# Patient Record
Sex: Male | Born: 1980 | Race: White | Hispanic: No | Marital: Married | Smoking: Current every day smoker
Health system: Southern US, Community
[De-identification: ages and names within clinical notes are randomized; demographics above are authoritative.]

## PROBLEM LIST (undated history)

## (undated) ENCOUNTER — Emergency Department (HOSPITAL_BASED_OUTPATIENT_CLINIC_OR_DEPARTMENT_OTHER): Payer: No Typology Code available for payment source

## (undated) HISTORY — PX: HERNIA REPAIR: SHX51

---

## 2017-06-21 ENCOUNTER — Emergency Department (HOSPITAL_BASED_OUTPATIENT_CLINIC_OR_DEPARTMENT_OTHER)
Admission: EM | Admit: 2017-06-21 | Discharge: 2017-06-21 | Disposition: A | Discharge status: Home / Self Care | Payer: BLUE CROSS/BLUE SHIELD | Attending: Emergency Medicine | Admitting: Emergency Medicine

## 2017-06-21 ENCOUNTER — Other Ambulatory Visit: Payer: Self-pay

## 2017-06-21 ENCOUNTER — Emergency Department (HOSPITAL_BASED_OUTPATIENT_CLINIC_OR_DEPARTMENT_OTHER): Payer: BLUE CROSS/BLUE SHIELD

## 2017-06-21 ENCOUNTER — Encounter (HOSPITAL_BASED_OUTPATIENT_CLINIC_OR_DEPARTMENT_OTHER): Payer: Self-pay

## 2017-06-21 DIAGNOSIS — M545 Low back pain, unspecified: Secondary | ICD-10-CM

## 2017-06-21 DIAGNOSIS — F17200 Nicotine dependence, unspecified, uncomplicated: Secondary | ICD-10-CM | POA: Diagnosis not present

## 2017-06-21 MED ORDER — CYCLOBENZAPRINE HCL 5 MG PO TABS
5.0000 mg | ORAL_TABLET | Freq: Once | ORAL | Status: AC
  Administered 2017-06-21: 5 mg via ORAL
  Filled 2017-06-21: qty 1

## 2017-06-21 MED ORDER — OXYCODONE-ACETAMINOPHEN 5-325 MG PO TABS
1.0000 | ORAL_TABLET | Freq: Once | ORAL | Status: AC
  Administered 2017-06-21: 1 via ORAL
  Filled 2017-06-21: qty 1

## 2017-06-21 MED ORDER — CYCLOBENZAPRINE HCL 10 MG PO TABS: 10.0000 mg | ORAL_TABLET | Freq: Two times a day (BID) | ORAL | 0 refills | Status: DC | PRN

## 2017-06-21 MED ORDER — METHYLPREDNISOLONE 4 MG PO TBPK
ORAL_TABLET | ORAL | 0 refills | Status: DC
Start: 2017-06-21 — End: 2018-03-05

## 2017-06-21 NOTE — ED Notes (Signed)
ED Provider at bedside. 

## 2017-06-21 NOTE — ED Notes (Signed)
Patient transported to X-ray 

## 2017-06-21 NOTE — Discharge Instructions (Signed)
It was my pleasure taking care of you today!   You have been seen in the Emergency Department today for back pain.   Take steroids as directed. Flexeril is your muscle relaxer to take as needed. In addition to this, use ice and/or heat for additional pain relief.  Your back pain should get better over the next 2 weeks. Please follow up with your doctor this week for a recheck if still having symptoms.  COLD THERAPY DIRECTIONS:  Ice or gel packs can be used to reduce both pain and swelling. Ice is the most helpful within the first 24 to 48 hours after an injury or flareup from overusing a muscle or joint.  Ice is effective, has very few side effects, and is safe for most people to use.    Return to the ED for worsening back pain, fever, weakness or numbness of either leg, or if you develop either (1) an inability to urinate or have bowel movements, or (2) loss of your ability to control your bathroom functions (if you start having "accidents"), or if you develop other new symptoms that concern you.

## 2017-06-21 NOTE — ED Provider Notes (Signed)
[facility] EMERGENCY DEPARTMENT Provider Note   CSN: [id number] Arrival date & time: 06/21/17  1228     History   Chief Complaint Chief Complaint  Patient presents with  . Back Pain    HPI Maurice Allen is a 36 y.o. male.  The history is provided by the patient and medical records. No language interpreter was used.  Back Pain   Pertinent negatives include no fever, no numbness, no abdominal pain, no dysuria and no weakness.   Maurice Allen is an otherwise healthy 36 y.o. male who presents to the Emergency Department complaining of mid to right-sided low back which began one week ago, but acutely worsened over the last 2-3 days. Pain worse with certain movements. Hx of similar in the past, but never this severe. Typically improves with BC powder, but he tried that this week with no improvement. Patient denies upper back or neck pain. No fever, saddle anesthesia, weakness, numbness, urinary complaints including retention/incontinence. No history of cancer, IVDU, or recent spinal procedures.  History reviewed. No pertinent past medical history.  There are no active problems to display for this patient.   Past Surgical History:  Procedure Laterality Date  . HERNIA REPAIR         Home Medications    Prior to Admission medications   Medication Sig Start Date End Date Taking? Authorizing Provider  cyclobenzaprine (FLEXERIL) 10 MG tablet Take 1 tablet (10 mg total) 2 (two) times daily as needed by mouth for muscle spasms. 06/21/17   Ward, Chase PicketJaime Pilcher, PA-C  methylPREDNISolone (MEDROL DOSEPAK) 4 MG TBPK tablet Take as directed on package. 06/21/17   Ward, Chase PicketJaime Pilcher, PA-C    Family History No family history on file.  Social History Social History   Tobacco Use  . Smoking status: Current Every Day Smoker  . Smokeless tobacco: Current User  Substance Use Topics  . Alcohol use: Yes    Comment: occ  . Drug use: No     Allergies   Patient has no known  allergies.   Review of Systems Review of Systems  Constitutional: Negative for chills and fever.  Gastrointestinal: Negative for abdominal pain, nausea and vomiting.  Genitourinary: Negative for difficulty urinating and dysuria.  Musculoskeletal: Positive for back pain.  Allergic/Immunologic: Negative for immunocompromised state.  Neurological: Negative for weakness and numbness.     Physical Exam Updated Vital Signs BP (!) 145/88 (BP Location: Left Arm)   Pulse 93   Temp 98.2 F (36.8 C) (Oral)   Resp 18   Ht 5\' 10"  (1.778 m)   Wt 83.9 kg (185 lb)   SpO2 98%   BMI 26.54 kg/m   Physical Exam  Constitutional: He is oriented to person, place, and time. He appears well-developed and well-nourished.  Neck:  No midline or paraspinal tenderness. Full ROM without pain.  Cardiovascular: Normal rate, regular rhythm, normal heart sounds and intact distal pulses.  Pulmonary/Chest: Effort normal and breath sounds normal. No respiratory distress.  Abdominal: Soft. Bowel sounds are normal. He exhibits no distension. There is no tenderness.  Musculoskeletal:       Back:  Tenderness to palpation as depicted in image. 5/5 muscle strength in bilateral LE's. Straight leg raises are negative bilaterally for radicular symptoms. Able to ambulate independently with steady gait.  Neurological: He is alert and oriented to person, place, and time. He has normal reflexes.  Bilateral lower extremities neurovascularly intact.  Skin: Skin is warm and dry. No rash noted. No erythema.  Nursing note and vitals reviewed.    ED Treatments / Results  Labs (all labs ordered are listed, but only abnormal results are displayed) Labs Reviewed - No data to display  EKG  EKG Interpretation None       Radiology Dg Lumbar Spine Complete  Result Date: 06/21/2017 CLINICAL DATA:  No known injury, low back pain for 1 week EXAM: LUMBAR SPINE - COMPLETE 4+ VIEW COMPARISON:  None. FINDINGS: There is no  evidence of lumbar spine fracture. Alignment is normal. Intervertebral disc spaces are maintained. IMPRESSION: No acute osseous injury of the lumbar spine. Electronically Signed   By: Elige KoHetal  Patel   On: 06/21/2017 14:07    Procedures Procedures (including critical care time)  Medications Ordered in ED Medications  oxyCODONE-acetaminophen (PERCOCET/ROXICET) 5-325 MG per tablet 1 tablet (1 tablet Oral Given 06/21/17 1349)  cyclobenzaprine (FLEXERIL) tablet 5 mg (5 mg Oral Given 06/21/17 1350)     Initial Impression / Assessment and Plan / ED Course  I have reviewed the triage vital signs and the nursing notes.  Pertinent labs & imaging results that were available during my care of the patient were reviewed by me and considered in my medical decision making (see chart for details).    Maurice SnooksJohn Krogstad is a 36 y.o. male who presents to ED for low back pain.   Patient demonstrates no lower extremity weakness, saddle anesthesia, bowel or bladder incontinence or neuro deficits. No concern for cauda equina. No fevers or other infectious symptoms to suggest that the patient's back pain is due to an infection. Lower extremities are neurovascularly intact and patient is ambulating without difficulty. I have reviewed return precautions, including the development of any of these signs or symptoms and the patient has voiced understanding. I reviewed symptomatic home care instructions and sports medicine follow-up if symptoms do not improve. RX for medrol dose pack and flexeril given. Patient voiced understanding and agreement with plan. All questions answered.  Final Clinical Impressions(s) / ED Diagnoses   Final diagnoses:  Acute right-sided low back pain without sciatica    ED Discharge Orders        Ordered    methylPREDNISolone (MEDROL DOSEPAK) 4 MG TBPK tablet     06/21/17 1454    cyclobenzaprine (FLEXERIL) 10 MG tablet  2 times daily PRN     06/21/17 1454       Ward, Chase PicketJaime Pilcher,  PA-C 06/21/17 1531    Loren RacerYelverton, David, MD 06/26/17 1745

## 2017-06-21 NOTE — ED Triage Notes (Signed)
C/o lower back pain x 1 week-worse x 3 days-denies injury-slow gait -NAD

## 2017-07-18 ENCOUNTER — Telehealth: Payer: Self-pay | Admitting: Medical

## 2017-07-18 NOTE — Telephone Encounter (Signed)
Was curious was curious why I am getting some CRM about new patients.  I keep reviewing these as if something needs to be done?  This is just an FYI about new patient?  I think it is marked as high priority?

## 2017-07-28 ENCOUNTER — Ambulatory Visit: Payer: BLUE CROSS/BLUE SHIELD | Admitting: Medical

## 2017-11-01 DIAGNOSIS — J209 Acute bronchitis, unspecified: Secondary | ICD-10-CM | POA: Diagnosis not present

## 2017-11-01 DIAGNOSIS — J0100 Acute maxillary sinusitis, unspecified: Secondary | ICD-10-CM | POA: Diagnosis not present

## 2018-03-05 ENCOUNTER — Ambulatory Visit (INDEPENDENT_AMBULATORY_CARE_PROVIDER_SITE_OTHER): Payer: 59 | Admitting: Medical

## 2018-03-05 ENCOUNTER — Encounter: Payer: Self-pay | Admitting: Medical

## 2018-03-05 ENCOUNTER — Encounter (INDEPENDENT_AMBULATORY_CARE_PROVIDER_SITE_OTHER): Payer: Self-pay

## 2018-03-05 VITALS — BP 134/80 | HR 98 | Temp 98.3°F | Resp 16 | Ht 70.0 in | Wt 205.8 lb

## 2018-03-05 DIAGNOSIS — L989 Disorder of the skin and subcutaneous tissue, unspecified: Secondary | ICD-10-CM | POA: Diagnosis not present

## 2018-03-05 DIAGNOSIS — Z0000 Encounter for general adult medical examination without abnormal findings: Secondary | ICD-10-CM | POA: Diagnosis not present

## 2018-03-05 DIAGNOSIS — Z113 Encounter for screening for infections with a predominantly sexual mode of transmission: Secondary | ICD-10-CM | POA: Diagnosis not present

## 2018-03-05 DIAGNOSIS — Z23 Encounter for immunization: Secondary | ICD-10-CM

## 2018-03-05 NOTE — Patient Instructions (Addendum)
For you wellness exam today I have ordered cbc, cmp, lipid panel, ua and hiv.(future labs to be done fasting)  Vaccine given today tdap.  Recommend exercise and healthy diet.  We will let you know lab results as they come in.  Follow up date appointment will be determined after lab review.   Please stop smoking or decrease even further.    Preventive Care 18-39 Years, Male Preventive care refers to lifestyle choices and visits with your health care provider that can promote health and wellness. What does preventive care include?  A yearly physical exam. This is also called an annual well check.  Dental exams once or twice a year.  Routine eye exams. Ask your health care provider how often you should have your eyes checked.  Personal lifestyle choices, including: ? Daily care of your teeth and gums. ? Regular physical activity. ? Eating a healthy diet. ? Avoiding tobacco and drug use. ? Limiting alcohol use. ? Practicing safe sex. What happens during an annual well check? The services and screenings done by your health care provider during your annual well check will depend on your age, overall health, lifestyle risk factors, and family history of disease. Counseling Your health care provider may ask you questions about your:  Alcohol use.  Tobacco use.  Drug use.  Emotional well-being.  Home and relationship well-being.  Sexual activity.  Eating habits.  Work and work Statistician.  Screening You may have the following tests or measurements:  Height, weight, and BMI.  Blood pressure.  Lipid and cholesterol levels. These may be checked every 5 years starting at age 30.  Diabetes screening. This is done by checking your blood sugar (glucose) after you have not eaten for a while (fasting).  Skin check.  Hepatitis C blood test.  Hepatitis B blood test.  Sexually transmitted disease (STD) testing.  Discuss your test results, treatment options, and if  necessary, the need for more tests with your health care provider. Vaccines Your health care provider may recommend certain vaccines, such as:  Influenza vaccine. This is recommended every year.  Tetanus, diphtheria, and acellular pertussis (Tdap, Td) vaccine. You may need a Td booster every 10 years.  Varicella vaccine. You may need this if you have not been vaccinated.  HPV vaccine. If you are 11 or younger, you may need three doses over 6 months.  Measles, mumps, and rubella (MMR) vaccine. You may need at least one dose of MMR.You may also need a second dose.  Pneumococcal 13-valent conjugate (PCV13) vaccine. You may need this if you have certain conditions and have not been vaccinated.  Pneumococcal polysaccharide (PPSV23) vaccine. You may need one or two doses if you smoke cigarettes or if you have certain conditions.  Meningococcal vaccine. One dose is recommended if you are age 2-21 years and a first-year college student living in a residence hall, or if you have one of several medical conditions. You may also need additional booster doses.  Hepatitis A vaccine. You may need this if you have certain conditions or if you travel or work in places where you may be exposed to hepatitis A.  Hepatitis B vaccine. You may need this if you have certain conditions or if you travel or work in places where you may be exposed to hepatitis B.  Haemophilus influenzae type b (Hib) vaccine. You may need this if you have certain risk factors.  Talk to your health care provider about which screenings and vaccines you need and  how often you need them. This information is not intended to replace advice given to you by your health care provider. Make sure you discuss any questions you have with your health care provider. Document Released: 09/27/2001 Document Revised: 04/20/2016 Document Reviewed: 06/02/2015 Elsevier Interactive Patient Education  2018 Elsevier Inc.  

## 2018-03-05 NOTE — Progress Notes (Signed)
Subjective:    Patient ID: Mallie SnooksJohn Arciniega, male    DOB: [birth year], 37 y.o.   MRN: [id number]  HPI   Pt in for first time.  Pt state no chronic know medical problems.   Pt works [employer]. He Drives a dump truck. Pt does not exercise regularly. Pt has 6 children. Pt drinks coffee every morning and 1-2 soft drinks a day(mountain dew). States very minimal alcohol use. About 6 pack in a month.   Pt smokes pack a day. Down from 3 pack a day one year ago.  Pt not fasting today.  Pt not sure when he last got tetanus.    Review of Systems  Constitutional: Negative for chills, fatigue and fever.  HENT: Negative for congestion, ear pain, hearing loss, nosebleeds, postnasal drip, rhinorrhea, sinus pressure, sinus pain and sore throat.   Respiratory: Negative for apnea, cough, chest tightness, shortness of breath and wheezing.   Cardiovascular: Negative for chest pain and palpitations.  Gastrointestinal: Negative for abdominal distention, blood in stool, constipation, nausea and vomiting.  Genitourinary:       Some erectile dysfunction.  Musculoskeletal: Negative for back pain and gait problem.  Skin: Negative for rash.       Left forearam skin lesion. Some growth and change over past 4-5 years.  Neurological: Negative for seizures, facial asymmetry, weakness and light-headedness.  Hematological: Negative for adenopathy. Does not bruise/bleed easily.  Psychiatric/Behavioral: Negative for behavioral problems.    History reviewed. No pertinent past medical history.   Social History   Socioeconomic History  . Marital status: Married    Spouse name: Not on file  . Number of children: Not on file  . Years of education: Not on file  . Highest education level: Not on file  Occupational History  . Not on file  Social Needs  . Financial resource strain: Not on file  . Food insecurity:    Worry: Not on file    Inability: Not on file  . Transportation needs:    Medical: Not on  file    Non-medical: Not on file  Tobacco Use  . Smoking status: Current Every Day Smoker    Packs/day: 1.00    Types: Cigarettes  . Smokeless tobacco: Current User  Substance and Sexual Activity  . Alcohol use: Yes    Comment: occasional.  . Drug use: No  . Sexual activity: Yes  Lifestyle  . Physical activity:    Days per week: Not on file    Minutes per session: Not on file  . Stress: Not on file  Relationships  . Social connections:    Talks on phone: Not on file    Gets together: Not on file    Attends religious service: Not on file    Active member of club or organization: Not on file    Attends meetings of clubs or organizations: Not on file    Relationship status: Not on file  . Intimate partner violence:    Fear of current or ex partner: Not on file    Emotionally abused: Not on file    Physically abused: Not on file    Forced sexual activity: Not on file  Other Topics Concern  . Not on file  Social History Narrative  . Not on file    Past Surgical History:  Procedure Laterality Date  . HERNIA REPAIR     4th grade.    Family History  Problem Relation Age of Onset  . Asthma  Mother   . Hyperlipidemia Father   . Heart attack Father     No Known Allergies  No current outpatient medications on file prior to visit.   No current facility-administered medications on file prior to visit.     BP 134/80   Pulse 98   Temp 98.3 F (36.8 C) (Oral)   Resp 16   Ht 5\' 10"  (1.778 m)   Wt 205 lb 12.8 oz (93.4 kg)   SpO2 99%   BMI 29.53 kg/m       Objective:   Physical Exam  General Mental Status- Alert. General Appearance- Not in acute distress.   Skin General: Color- Normal Color. Moisture- Normal Moisture.  One mid mole/lesion on forearm(irregular raise and 9 mm approximate in size) Also one area on back mid thorax are 9 mm in size.  Neck Carotid Arteries- Normal color. Moisture- Normal Moisture. No carotid bruits. No JVD.  Chest and Lung  Exam Auscultation: Breath Sounds:-Normal.  Cardiovascular Auscultation:Rythm- Regular. Murmurs & Other Heart Sounds:Auscultation of the heart reveals- No Murmurs.  Abdomen Inspection:-Inspeection Normal. Palpation/Percussion:Note:No mass. Palpation and Percussion of the abdomen reveal- Non Tender, Non Distended + BS, no rebound or guarding.  Neurologic Cranial Nerve exam:- CN III-XII intact(No nystagmus), symmetric smile. Strength:- 5/5 equal and symmetric strength both upper and lower extremities.  Genital exam- deferred today.     Assessment & Plan:  For you wellness exam today I have ordered cbc, cmp, lipid panel, ua and hiv.(future labs to be done fasting)  Vaccine given today tdap.  Recommend exercise and healthy diet.  We will let you know lab results as they come in.  Follow up date appointment will be determined after lab review.   Please stop smoking or decrease even further.   [name], PA-C

## 2018-03-06 ENCOUNTER — Other Ambulatory Visit (INDEPENDENT_AMBULATORY_CARE_PROVIDER_SITE_OTHER): Payer: 59

## 2018-03-06 ENCOUNTER — Telehealth: Payer: Self-pay | Admitting: Medical

## 2018-03-06 DIAGNOSIS — Z113 Encounter for screening for infections with a predominantly sexual mode of transmission: Secondary | ICD-10-CM | POA: Diagnosis not present

## 2018-03-06 DIAGNOSIS — Z0000 Encounter for general adult medical examination without abnormal findings: Secondary | ICD-10-CM

## 2018-03-06 DIAGNOSIS — R319 Hematuria, unspecified: Secondary | ICD-10-CM

## 2018-03-06 LAB — URINALYSIS, ROUTINE W REFLEX MICROSCOPIC
Bilirubin Urine: NEGATIVE
KETONES UR: NEGATIVE
Leukocytes, UA: NEGATIVE
Nitrite: NEGATIVE
Specific Gravity, Urine: 1.025 (ref 1.000–1.030)
Urine Glucose: NEGATIVE
Urobilinogen, UA: 1 (ref 0.0–1.0)
pH: 6 (ref 5.0–8.0)

## 2018-03-06 NOTE — Telephone Encounter (Signed)
Future urine order placed. °

## 2018-03-07 ENCOUNTER — Other Ambulatory Visit: Payer: 59

## 2018-03-07 ENCOUNTER — Encounter: Payer: Self-pay | Admitting: Medical

## 2018-03-07 LAB — COMPREHENSIVE METABOLIC PANEL
ALBUMIN: 4.5 g/dL (ref 3.5–5.2)
ALT: 90 U/L — AB (ref 0–53)
AST: 34 U/L (ref 0–37)
Alkaline Phosphatase: 75 U/L (ref 39–117)
BILIRUBIN TOTAL: 0.6 mg/dL (ref 0.2–1.2)
BUN: 16 mg/dL (ref 6–23)
CO2: 28 mEq/L (ref 19–32)
CREATININE: 1.12 mg/dL (ref 0.40–1.50)
Calcium: 9.4 mg/dL (ref 8.4–10.5)
Chloride: 103 mEq/L (ref 96–112)
GFR: 79.33 mL/min (ref >60.00)
GLUCOSE: 82 mg/dL (ref 70–99)
POTASSIUM: 3.8 meq/L (ref 3.5–5.1)
SODIUM: 139 meq/L (ref 135–145)
TOTAL PROTEIN: 6.9 g/dL (ref 6.0–8.3)

## 2018-03-07 LAB — LIPID PANEL
CHOLESTEROL: 219 mg/dL — AB (ref 0–200)
HDL: 44.6 mg/dL (ref >39.00)
LDL CALC: 150 mg/dL — AB (ref 0–99)
NonHDL: 174.66
TRIGLYCERIDES: 122 mg/dL (ref 0.0–149.0)
Total CHOL/HDL Ratio: 5
VLDL: 24.4 mg/dL (ref 0.0–40.0)

## 2018-03-07 LAB — CBC WITH DIFFERENTIAL/PLATELET
BASOS ABS: 0.1 10*3/uL (ref 0.0–0.1)
Basophils Relative: 0.6 % (ref 0.0–3.0)
Eosinophils Absolute: 0.2 10*3/uL (ref 0.0–0.7)
Eosinophils Relative: 1.6 % (ref 0.0–5.0)
HCT: 46.9 % (ref 39.0–52.0)
Hemoglobin: 16.5 g/dL (ref 13.0–17.0)
LYMPHS ABS: 2.7 10*3/uL (ref 0.7–4.0)
Lymphocytes Relative: 22.4 % (ref 12.0–46.0)
MCHC: 35.3 g/dL (ref 30.0–36.0)
MCV: 92.9 fl (ref 78.0–100.0)
MONOS PCT: 5.5 % (ref 3.0–12.0)
Monocytes Absolute: 0.7 10*3/uL (ref 0.1–1.0)
NEUTROS ABS: 8.5 10*3/uL — AB (ref 1.4–7.7)
NEUTROS PCT: 69.9 % (ref 43.0–77.0)
Platelets: 190 10*3/uL (ref 150.0–400.0)
RBC: 5.04 Mil/uL (ref 4.22–5.81)
RDW: 13 % (ref 11.5–15.5)
WBC: 12.2 10*3/uL — ABNORMAL HIGH (ref 4.0–10.5)

## 2018-03-07 LAB — HIV ANTIBODY (ROUTINE TESTING W REFLEX): HIV 1&2 Ab, 4th Generation: NONREACTIVE

## 2018-03-08 ENCOUNTER — Telehealth: Payer: Self-pay | Admitting: Medical

## 2018-03-08 NOTE — Telephone Encounter (Signed)
Will you let Pryor CuriaGwenn now that patient phone number was temporarily not working but he paid bill and now hs phone is working. So his number on file is good. Please have Gwenn contact dermatologist office or can she call on dermatologist behalf and give patient info on appointment date.

## 2018-03-09 NOTE — Telephone Encounter (Signed)
Please have pt call dermatology directly and provide update info and schedule appt [phone]

## 2018-03-09 NOTE — Telephone Encounter (Signed)
Tried to reach pt. Pt vm is full could not leave message.

## 2018-05-25 ENCOUNTER — Telehealth: Payer: Self-pay | Admitting: Medical

## 2018-05-25 NOTE — Telephone Encounter (Signed)
Will you call patient and see why he missed dermatologist appointment?

## 2018-05-25 NOTE — Telephone Encounter (Signed)
If patient was notified of referral date, given appointment and he no showed then cancel dermatologist referral.

## 2018-07-20 ENCOUNTER — Ambulatory Visit (INDEPENDENT_AMBULATORY_CARE_PROVIDER_SITE_OTHER): Payer: 59 | Admitting: Internal Medicine

## 2018-07-20 ENCOUNTER — Encounter: Payer: Self-pay | Admitting: Internal Medicine

## 2018-07-20 ENCOUNTER — Ambulatory Visit (HOSPITAL_BASED_OUTPATIENT_CLINIC_OR_DEPARTMENT_OTHER)
Admission: RE | Admit: 2018-07-20 | Discharge: 2018-07-20 | Disposition: A | Discharge status: Home / Self Care | Payer: 59 | Source: Ambulatory Visit | Attending: Internal Medicine | Admitting: Internal Medicine

## 2018-07-20 VITALS — BP 122/80 | HR 108 | Temp 98.2°F | Resp 16 | Ht 70.0 in | Wt 214.5 lb

## 2018-07-20 DIAGNOSIS — R0602 Shortness of breath: Secondary | ICD-10-CM | POA: Diagnosis not present

## 2018-07-20 DIAGNOSIS — R05 Cough: Secondary | ICD-10-CM

## 2018-07-20 DIAGNOSIS — R059 Cough, unspecified: Secondary | ICD-10-CM

## 2018-07-20 DIAGNOSIS — J019 Acute sinusitis, unspecified: Secondary | ICD-10-CM | POA: Diagnosis not present

## 2018-07-20 MED ORDER — AMOXICILLIN-POT CLAVULANATE 875-125 MG PO TABS: 1.0000 | ORAL_TABLET | Freq: Two times a day (BID) | ORAL | 0 refills | Status: DC

## 2018-07-20 MED ORDER — PREDNISONE 20 MG PO TABS: 20.0000 mg | ORAL_TABLET | Freq: Every day | ORAL | 0 refills | Status: DC

## 2018-07-20 NOTE — Patient Instructions (Signed)
GO TO THE LAB : Get the blood work      STOP BY THE FIRST FLOOR:  get the XR   Rest, fluids , tylenol  For cough:  Take Mucinex DM twice a day as needed until better You can also take TheraFlu at night   For nasal congestion: Use   Flonase : 2 nasal sprays on each side of the nose in the morning until you feel better   Will send an antibiotics soon after your XR  Call if not gradually better over the next  4-5 days  Go tot he ER  anytime if the symptoms are severe, you have high fever, short of breath, chest pain

## 2018-07-20 NOTE — Progress Notes (Signed)
Subjective:    Patient ID: Maurice Allen, male    DOB: 01/17/1981, 37 y.o.   MRN: [id number]  DOS:  07/20/2018 Type of visit - description : acute Symptoms started 8 days ago and he is not getting better. Severe cough, initially with dark brownish sputum, now dry for the last 24 hours Abundant nasal discharge, mostly "neon yellow" , sometimes clear. Some nausea without vomiting. Has a headache, mostly behind the eyes, radiates like a "wrap around my head".    Review of Systems  +  fever  , sybjective Denies chest pain no difficulty breathing Denies stiff neck or major myalgias. No rash  No past medical history on file.  Past Surgical History:  Procedure Laterality Date  . HERNIA REPAIR     4th grade.    Social History   Socioeconomic History  . Marital status: Married    Spouse name: Not on file  . Number of children: Not on file  . Years of education: Not on file  . Highest education level: Not on file  Occupational History  . Not on file  Social Needs  . Financial resource strain: Not on file  . Food insecurity:    Worry: Not on file    Inability: Not on file  . Transportation needs:    Medical: Not on file    Non-medical: Not on file  Tobacco Use  . Smoking status: Current Every Day Smoker    Packs/day: 1.00    Types: Cigarettes  . Smokeless tobacco: Current User  Substance and Sexual Activity  . Alcohol use: Yes    Comment: occasional.  . Drug use: No  . Sexual activity: Yes  Lifestyle  . Physical activity:    Days per week: Not on file    Minutes per session: Not on file  . Stress: Not on file  Relationships  . Social connections:    Talks on phone: Not on file    Gets together: Not on file    Attends religious service: Not on file    Active member of club or organization: Not on file    Attends meetings of clubs or organizations: Not on file    Relationship status: Not on file  . Intimate partner violence:    Fear of current or ex partner:  Not on file    Emotionally abused: Not on file    Physically abused: Not on file    Forced sexual activity: Not on file  Other Topics Concern  . Not on file  Social History Narrative  . Not on file      Allergies as of 07/20/2018   No Known Allergies     Medication List    as of 07/20/2018  1:53 PM   You have not been prescribed any medications.         Objective:   Physical Exam BP 122/80 (BP Location: Left Arm, Patient Position: Sitting, Cuff Size: Normal)   Pulse (!) 108   Temp 98.2 F (36.8 C) (Oral)   Resp 16   Ht 5\' 10"  (1.778 m)   Wt 214 lb 8 oz (97.3 kg)   SpO2 98%   BMI 30.78 kg/m   General:   Well developed, NAD, BMI noted.  Frequent cough noted HEENT:  Normocephalic . Face symmetric, atraumatic.  TMs bulge, throat symmetric and not red.  Nose is quite congested, sinuses no TTP Neck: Full range of motion Lungs:  Scattered rhonchi otherwise clear Normal respiratory  effort, no intercostal retractions, no accessory muscle use. Heart: RRR,  no murmur.  No pretibial edema bilaterally  Skin: Not pale. Not jaundice Neurologic:  alert & oriented X3.  Speech normal, gait appropriate for age and unassisted Psych--  Cognition and judgment appear intact.  Cooperative with normal attention span and concentration.  Behavior appropriate. No anxious or depressed appearing.      Assessment & Plan:    37 year old gentleman, healthy, presents with  Cough: Cough, sinus pressure, headache and nasal yellow discharge for 8 days.  Most likely he has sinusitis, bronchitis; also need to rule out pneumonia.  Although he has headache, he does not look toxic and the neck is supple.  Doubt meningitis. Plan: CBC, CMP chest x-ray. Will need antibiotics with the chest x-ray results.  Consider prednisone. See AVS Please call patient: Chest x-ray no pneumonia, blood work pending.  Send Augmentin 850 mg twice daily for 10 days for presumed sinusitis.  Also prednisone 20 mg 1  p.o. daily x5 days.

## 2018-07-20 NOTE — Progress Notes (Signed)
Pre visit review using our clinic review tool, if applicable. No additional management support is needed unless otherwise documented below in the visit note. 

## 2018-07-21 LAB — CBC WITH DIFFERENTIAL/PLATELET
BASOS PCT: 0.9 %
Basophils Absolute: 82 cells/uL (ref 0–200)
Eosinophils Absolute: 255 cells/uL (ref 15–500)
Eosinophils Relative: 2.8 %
HCT: 46.8 % (ref 38.5–50.0)
Hemoglobin: 16.4 g/dL (ref 13.2–17.1)
Lymphs Abs: 3094 cells/uL (ref 850–3900)
MCH: 31.6 pg (ref 27.0–33.0)
MCHC: 35 g/dL (ref 32.0–36.0)
MCV: 90.2 fL (ref 80.0–100.0)
MONOS PCT: 6.5 %
MPV: 11 fL (ref 7.5–12.5)
NEUTROS ABS: 5078 {cells}/uL (ref 1500–7800)
Neutrophils Relative %: 55.8 %
PLATELETS: 253 10*3/uL (ref 140–400)
RBC: 5.19 10*6/uL (ref 4.20–5.80)
RDW: 12.2 % (ref 11.0–15.0)
TOTAL LYMPHOCYTE: 34 %
WBC: 9.1 10*3/uL (ref 3.8–10.8)
WBCMIX: 592 {cells}/uL (ref 200–950)

## 2018-07-21 LAB — COMPREHENSIVE METABOLIC PANEL
AG Ratio: 1.9 (calc) (ref 1.0–2.5)
ALKALINE PHOSPHATASE (APISO): 82 U/L (ref 40–115)
ALT: 25 U/L (ref 9–46)
AST: 16 U/L (ref 10–40)
Albumin: 4.6 g/dL (ref 3.6–5.1)
BUN: 11 mg/dL (ref 7–25)
CHLORIDE: 103 mmol/L (ref 98–110)
CO2: 23 mmol/L (ref 20–32)
CREATININE: 1.09 mg/dL (ref 0.60–1.35)
Calcium: 9.9 mg/dL (ref 8.6–10.3)
GLUCOSE: 72 mg/dL (ref 65–99)
Globulin: 2.4 g/dL (calc) (ref 1.9–3.7)
Potassium: 3.8 mmol/L (ref 3.5–5.3)
Sodium: 140 mmol/L (ref 135–146)
Total Bilirubin: 0.4 mg/dL (ref 0.2–1.2)
Total Protein: 7 g/dL (ref 6.1–8.1)

## 2018-09-18 ENCOUNTER — Ambulatory Visit (INDEPENDENT_AMBULATORY_CARE_PROVIDER_SITE_OTHER): Payer: 59 | Admitting: Medical

## 2018-09-18 ENCOUNTER — Encounter: Payer: Self-pay | Admitting: Medical

## 2018-09-18 VITALS — BP 138/94 | HR 94 | Temp 98.2°F | Resp 14 | Ht 70.0 in | Wt 219.0 lb

## 2018-09-18 DIAGNOSIS — F17200 Nicotine dependence, unspecified, uncomplicated: Secondary | ICD-10-CM

## 2018-09-18 DIAGNOSIS — F32A Depression, unspecified: Secondary | ICD-10-CM

## 2018-09-18 DIAGNOSIS — R05 Cough: Secondary | ICD-10-CM | POA: Diagnosis not present

## 2018-09-18 DIAGNOSIS — R6882 Decreased libido: Secondary | ICD-10-CM | POA: Diagnosis not present

## 2018-09-18 DIAGNOSIS — F329 Major depressive disorder, single episode, unspecified: Secondary | ICD-10-CM

## 2018-09-18 DIAGNOSIS — R059 Cough, unspecified: Secondary | ICD-10-CM

## 2018-09-18 DIAGNOSIS — R5383 Other fatigue: Secondary | ICD-10-CM | POA: Diagnosis not present

## 2018-09-18 DIAGNOSIS — N529 Male erectile dysfunction, unspecified: Secondary | ICD-10-CM

## 2018-09-18 MED ORDER — BUPROPION HCL ER (XL) 150 MG PO TB24: 150.0000 mg | ORAL_TABLET | Freq: Every day | ORAL | 3 refills | Status: DC

## 2018-09-18 NOTE — Progress Notes (Signed)
Subjective:    Patient ID: Maurice Allen, male    DOB: 1980/11/04, 38 y.o.   MRN: [id number]  HPI . Pt states ever since early teens he has symptoms of depression. He went on line and states he saw symptoms of depression. Pt scored 13 on phq 9. But no suicidal thoughts.  He tells me poor appetite, poor sleep, decreased interest in doing things he used to like to do. He is sad all the time and very irritable easily. Recently not wanting to spend time with kids. He states always seemed to sad since age 74 yo.  Pt states he has ED. He states he can't maintain erection for long. Only 2 times in past month notes waking with erection. He states 2 days ago was able to have erection and maintain it for sex.   Pt smokes pack- pack and half a day.   Pt had used wellbutrin past for ADD. He thought it may have helped with attention.   Pt wants to try for one more child.    Review of Systems  Constitutional: Positive for fatigue. Negative for chills and fever.  HENT: Negative for congestion and ear pain.   Respiratory: Positive for cough. Negative for chest tightness, shortness of breath and wheezing.        Occasional cough with smoking.   Cardiovascular: Negative for chest pain and palpitations.  Gastrointestinal: Negative for abdominal pain.  Genitourinary: Negative for difficulty urinating, flank pain, hematuria, penile pain and scrotal swelling.       Pt has ED.  Musculoskeletal: Negative for back pain and myalgias.  Skin: Negative for rash.  Neurological: Negative for dizziness, seizures and weakness.  Hematological: Negative for adenopathy. Does not bruise/bleed easily.  Psychiatric/Behavioral: Positive for dysphoric mood. Negative for behavioral problems, decreased concentration, self-injury and suicidal ideas. The patient is not nervous/anxious.     No past medical history on file.   Social History   Socioeconomic History  . Marital status: Married    Spouse name: Not on file    . Number of children: Not on file  . Years of education: Not on file  . Highest education level: Not on file  Occupational History  . Not on file  Social Needs  . Financial resource strain: Not on file  . Food insecurity:    Worry: Not on file    Inability: Not on file  . Transportation needs:    Medical: Not on file    Non-medical: Not on file  Tobacco Use  . Smoking status: Current Every Day Smoker    Packs/day: 1.00    Types: Cigarettes  . Smokeless tobacco: Current User  Substance and Sexual Activity  . Alcohol use: Yes    Comment: occasional.  . Drug use: No  . Sexual activity: Yes  Lifestyle  . Physical activity:    Days per week: Not on file    Minutes per session: Not on file  . Stress: Not on file  Relationships  . Social connections:    Talks on phone: Not on file    Gets together: Not on file    Attends religious service: Not on file    Active member of club or organization: Not on file    Attends meetings of clubs or organizations: Not on file    Relationship status: Not on file  . Intimate partner violence:    Fear of current or ex partner: Not on file    Emotionally abused: Not  on file    Physically abused: Not on file    Forced sexual activity: Not on file  Other Topics Concern  . Not on file  Social History Narrative  . Not on file    Past Surgical History:  Procedure Laterality Date  . HERNIA REPAIR     4th grade.    Family History  Problem Relation Age of Onset  . Asthma Mother   . Hyperlipidemia Father   . Heart attack Father     No Known Allergies  Current Outpatient Medications on File Prior to Visit  Medication Sig Dispense Refill  . amoxicillin-clavulanate (AUGMENTIN) 875-125 MG tablet Take 1 tablet by mouth 2 (two) times daily. 20 tablet 0  . predniSONE (DELTASONE) 20 MG tablet Take 1 tablet (20 mg total) by mouth daily with breakfast. 5 tablet 0   No current facility-administered medications on file prior to visit.      BP (!) 138/94 (BP Location: Left Arm, Patient Position: Sitting, Cuff Size: Normal)   Pulse 94   Temp 98.2 F (36.8 C)   Resp 14   Ht 5\' 10"  (1.778 m)   Wt 219 lb (99.3 kg)   SpO2 98%   BMI 31.42 kg/m       Objective:   Physical Exam  General Mental Status- Alert. General Appearance- Not in acute distress.   Skin General: Color- Normal Color. Moisture- Normal Moisture.  Neck Carotid Arteries- Normal color. Moisture- Normal Moisture. No carotid bruits. No JVD.  Chest and Lung Exam Auscultation: Breath Sounds:-Normal.  Cardiovascular Auscultation:Rythm- Regular. Murmurs & Other Heart Sounds:Auscultation of the heart reveals- No Murmurs.  Abdomen Inspection:-Inspeection Normal. Palpation/Percussion:Note:No mass. Palpation and Percussion of the abdomen reveal- Non Tender, Non Distended + BS, no rebound or guarding.   Neurologic Cranial Nerve exam:- CN III-XII intact(No nystagmus), symmetric smile. Strength:- 5/5 equal and symmetric strength both upper and lower extremities.     Assessment & Plan:  You do appear to have moderate to severe depression in the past.  Also you are smoker.  I do think a good choice for you would be Wellbutrin.  You have used this before for ADD and found that helpful as well.  With depression would advise that if you have any thoughts of harm to self or others at any point then recommend [facility].  I want you to follow-up in 2 weeks to assess how you are doing with the medication.  History of smoking and I want you to start tapering off of smoking in 2 weeks after starting Wellbutrin.  He did have a history of intermittent cough with heavy smoking history years ago.  So please get chest x-ray today.  With history of rectal dysfunction and fatigue, I do want you to get future labs to be done over the next week.  Schedule for early morning 7 AM lab appointment.  Will get CBC, CMP, TSH and testosterone panel.  For erectal  dysfunction did offer Viagra but you declined.  If you change your mind and want me to send prescription to your pharmacy please let me know.  Follow-up in 2 weeks or as needed.  Esperanza RichtersEdward Laurielle Selmon, PA-C

## 2018-09-18 NOTE — Patient Instructions (Signed)
You do appear to have moderate to severe depression in the past.  Also you are smoker.  I do think a good choice for you would be Wellbutrin.  You have used this before for ADD and found that helpful as well.  With depression would advise that if you have any thoughts of harm to self or others at any point then recommend ED evaluation at [facility].  I want you to follow-up in 2 weeks to assess how you are doing with the medication.  History of smoking and I want you to start tapering off of smoking in 2 weeks after starting Wellbutrin.  He did have a history of intermittent cough with heavy smoking history years ago.  So please get chest x-ray today.  With history of rectal dysfunction and fatigue, I do want you to get future labs to be done over the next week.  Schedule for early morning 7 AM lab appointment.  Will get CBC, CMP, TSH and testosterone panel.  For erectal dysfunction did offer Viagra but you declined.  If you change your mind and want me to send prescription to your pharmacy please let me know.  Follow-up in 2 weeks or as needed.

## 2018-09-19 ENCOUNTER — Other Ambulatory Visit (INDEPENDENT_AMBULATORY_CARE_PROVIDER_SITE_OTHER): Payer: 59

## 2018-09-19 ENCOUNTER — Ambulatory Visit (HOSPITAL_BASED_OUTPATIENT_CLINIC_OR_DEPARTMENT_OTHER)
Admission: RE | Admit: 2018-09-19 | Discharge: 2018-09-19 | Disposition: A | Discharge status: Home / Self Care | Payer: 59 | Source: Ambulatory Visit | Attending: Medical | Admitting: Medical

## 2018-09-19 DIAGNOSIS — R5383 Other fatigue: Secondary | ICD-10-CM

## 2018-09-19 DIAGNOSIS — R6882 Decreased libido: Secondary | ICD-10-CM

## 2018-09-19 DIAGNOSIS — N529 Male erectile dysfunction, unspecified: Secondary | ICD-10-CM | POA: Diagnosis not present

## 2018-09-19 DIAGNOSIS — F17200 Nicotine dependence, unspecified, uncomplicated: Secondary | ICD-10-CM | POA: Diagnosis not present

## 2018-09-19 DIAGNOSIS — R05 Cough: Secondary | ICD-10-CM | POA: Diagnosis not present

## 2018-09-19 DIAGNOSIS — R059 Cough, unspecified: Secondary | ICD-10-CM

## 2018-09-19 LAB — CBC WITH DIFFERENTIAL/PLATELET
BASOS ABS: 0.1 10*3/uL (ref 0.0–0.1)
Basophils Relative: 0.8 % (ref 0.0–3.0)
Eosinophils Absolute: 0.2 10*3/uL (ref 0.0–0.7)
Eosinophils Relative: 3 % (ref 0.0–5.0)
HEMATOCRIT: 47.4 % (ref 39.0–52.0)
Hemoglobin: 16.7 g/dL (ref 13.0–17.0)
LYMPHS PCT: 30.3 % (ref 12.0–46.0)
Lymphs Abs: 2.4 10*3/uL (ref 0.7–4.0)
MCHC: 35.2 g/dL (ref 30.0–36.0)
MCV: 92.4 fl (ref 78.0–100.0)
MONOS PCT: 7.7 % (ref 3.0–12.0)
Monocytes Absolute: 0.6 10*3/uL (ref 0.1–1.0)
NEUTROS ABS: 4.5 10*3/uL (ref 1.4–7.7)
Neutrophils Relative %: 58.2 % (ref 43.0–77.0)
PLATELETS: 187 10*3/uL (ref 150.0–400.0)
RBC: 5.13 Mil/uL (ref 4.22–5.81)
RDW: 13.1 % (ref 11.5–15.5)
WBC: 7.8 10*3/uL (ref 4.0–10.5)

## 2018-09-19 LAB — COMPREHENSIVE METABOLIC PANEL
ALT: 29 U/L (ref 0–53)
AST: 14 U/L (ref 0–37)
Albumin: 4.6 g/dL (ref 3.5–5.2)
Alkaline Phosphatase: 79 U/L (ref 39–117)
BILIRUBIN TOTAL: 0.5 mg/dL (ref 0.2–1.2)
BUN: 21 mg/dL (ref 6–23)
CALCIUM: 9.4 mg/dL (ref 8.4–10.5)
CHLORIDE: 107 meq/L (ref 96–112)
CO2: 25 meq/L (ref 19–32)
Creatinine, Ser: 1.01 mg/dL (ref 0.40–1.50)
GFR: 83.84 mL/min (ref >60.00)
Glucose, Bld: 98 mg/dL (ref 70–99)
POTASSIUM: 4.3 meq/L (ref 3.5–5.1)
Sodium: 142 mEq/L (ref 135–145)
Total Protein: 6.7 g/dL (ref 6.0–8.3)

## 2018-09-19 LAB — TSH: TSH: 1.25 u[IU]/mL (ref 0.35–4.50)

## 2018-09-20 LAB — TESTOSTERONE TOTAL,FREE,BIO, MALES
ALBUMIN MSPROF: 4.3 g/dL (ref 3.6–5.1)
Sex Hormone Binding: 23 nmol/L (ref 10–50)
Testosterone, Bioavailable: 179 ng/dL (ref >=110.0)
Testosterone, Free: 90.9 pg/mL (ref 46.0–224.0)
Testosterone: 497 ng/dL (ref 250–827)

## 2019-05-21 ENCOUNTER — Emergency Department (HOSPITAL_BASED_OUTPATIENT_CLINIC_OR_DEPARTMENT_OTHER)
Admission: EM | Admit: 2019-05-21 | Discharge: 2019-05-21 | Disposition: A | Discharge status: Home / Self Care | Payer: 59 | Attending: Emergency Medicine | Admitting: Emergency Medicine

## 2019-05-21 ENCOUNTER — Other Ambulatory Visit: Payer: Self-pay

## 2019-05-21 ENCOUNTER — Encounter (HOSPITAL_BASED_OUTPATIENT_CLINIC_OR_DEPARTMENT_OTHER): Payer: Self-pay | Admitting: *Deleted

## 2019-05-21 DIAGNOSIS — R030 Elevated blood-pressure reading, without diagnosis of hypertension: Secondary | ICD-10-CM | POA: Insufficient documentation

## 2019-05-21 DIAGNOSIS — Z79899 Other long term (current) drug therapy: Secondary | ICD-10-CM | POA: Insufficient documentation

## 2019-05-21 DIAGNOSIS — F17210 Nicotine dependence, cigarettes, uncomplicated: Secondary | ICD-10-CM | POA: Insufficient documentation

## 2019-05-21 DIAGNOSIS — L237 Allergic contact dermatitis due to plants, except food: Secondary | ICD-10-CM | POA: Insufficient documentation

## 2019-05-21 MED ORDER — PREDNISONE 50 MG PO TABS
60.0000 mg | ORAL_TABLET | Freq: Once | ORAL | Status: AC
  Administered 2019-05-21: 60 mg via ORAL
  Filled 2019-05-21: qty 1

## 2019-05-21 MED ORDER — PREDNISONE 50 MG PO TABS: 50.0000 mg | ORAL_TABLET | Freq: Every day | ORAL | 0 refills | Status: DC

## 2019-05-21 NOTE — ED Provider Notes (Signed)
[facility] EMERGENCY DEPARTMENT Provider Note   CSN: [id number] Arrival date & time: 05/21/19  2002    History   Chief Complaint Chief Complaint  Patient presents with  . Rash    HPI Maurice Allen is a 38 y.o. male.   The history is provided by the patient.  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  Yesterday, he was cutting bamboo and noted a rash on his left hand at the end of the day.  Rash has spread to his left arm and now is on his right arm.  The rash started out as pruritic and now is burning.  He has also noted some swelling of his left hand.  He thinks he may been around poison ivy, but had not reacted to it in the past.  He denies any difficulty breathing or swallowing.  History reviewed. No pertinent past medical history.  There are no active problems to display for this patient.   Past Surgical History:  Procedure Laterality Date  . HERNIA REPAIR     4th grade.        Home Medications    Prior to Admission medications   Medication Sig Start Date End Date Taking? Authorizing Provider  amoxicillin-clavulanate (AUGMENTIN) 875-125 MG tablet Take 1 tablet by mouth 2 (two) times daily. 07/20/18   Colon Branch, MD  buPROPion (WELLBUTRIN XL) 150 MG 24 hr tablet Take 1 tablet (150 mg total) by mouth daily. 09/18/18   Saguier, Percell Miller, PA-C  predniSONE (DELTASONE) 20 MG tablet Take 1 tablet (20 mg total) by mouth daily with breakfast. 07/20/18   Colon Branch, MD    Family History Family History  Problem Relation Age of Onset  . Asthma Mother   . Hyperlipidemia Father   . Heart attack Father     Social History Social History   Tobacco Use  . Smoking status: Current Every Day Smoker    Packs/day: 1.00    Types: Cigarettes  . Smokeless tobacco: Current User  Substance Use Topics  . Alcohol use: Yes    Comment: occasional.  . Drug use: No     Allergies   Patient has no known allergies.   Review of Systems Review of Systems  Skin: Positive  for rash.  All other systems reviewed and are negative.    Physical Exam Updated Vital Signs BP (!) 145/103 (BP Location: Right Arm)   Pulse (!) 102   Temp 98.2 F (36.8 C) (Oral)   Resp 18   Ht 5\' 9"  (1.753 m)   Wt 99.8 kg   SpO2 95%   BMI 32.49 kg/m   Physical Exam Vitals signs and nursing note reviewed.    38 year old male, resting comfortably and in no acute distress. Vital signs are significant for elevated blood pressure and borderline elevated heart rate. Oxygen saturation is 95%, which is normal. Head is normocephalic and atraumatic. PERRLA, EOMI. Oropharynx is clear. Neck is nontender and supple without adenopathy or JVD. Back is nontender and there is no CVA tenderness. Lungs are clear without rales, wheezes, or rhonchi. Chest is nontender. Heart has regular rate and rhythm without murmur. Abdomen is soft, flat, nontender without masses or hepatosplenomegaly and peristalsis is normoactive. Extremities have no cyanosis or edema, full range of motion is present. Skin is warm and dry.  Vesicular rash on erythematous base is present over the left hand and forearm and also over the right forearm.  Appearance is consistent with poison ivy  dermatitis. Neurologic: Mental status is normal, cranial nerves are intact, there are no motor or sensory deficits.  ED Treatments / Results   Procedures Procedures (including critical care time)  Medications Ordered in ED Medications  predniSONE (DELTASONE) tablet 60 mg (has no administration in time range)     Initial Impression / Assessment and Plan / ED Course  I have reviewed the triage vital signs and the nursing notes.  Poison ivy dermatitis.  He is given a prescription for prednisone, advised use over-the-counter antihistamines as needed.  Also, blood pressure is noted to be elevated.  He is advised to have this rechecked as an outpatient.  Final Clinical Impressions(s) / ED Diagnoses   Final diagnoses:  Poison ivy  dermatitis  Elevated blood pressure reading without diagnosis of hypertension    ED Discharge Orders         Ordered    predniSONE (DELTASONE) 50 MG tablet  Daily,   Status:  Discontinued     05/21/19 2310    predniSONE (DELTASONE) 50 MG tablet  Daily     05/21/19 2314           Dione Booze, MD 05/21/19 2319

## 2019-05-21 NOTE — Discharge Instructions (Addendum)
Your blood pressure was a Cowher high today. Please have it rechecked in the next week. If your blood pressure stays elevated, you will need to take medication to control it. Uncontrolled blood pressure can lead to heart attacks, strokes, kidney failure.

## 2019-05-21 NOTE — ED Notes (Signed)
Pt states rash began yesterday while cutting down bamboo, was itching yesterday but today feels like sharp razors. No hx of allergies. tx'd self with hand san and poison ivy spray with no relief. More painful today and states rash is spreading down arms. Denies SOB or trouble breathing but smokes about a pack a day.

## 2019-05-21 NOTE — ED Triage Notes (Signed)
Rash to bilateral hands and arms x 2 days.

## 2019-07-05 ENCOUNTER — Ambulatory Visit (INDEPENDENT_AMBULATORY_CARE_PROVIDER_SITE_OTHER): Payer: Self-pay | Admitting: Medical

## 2019-07-05 ENCOUNTER — Other Ambulatory Visit: Payer: Self-pay

## 2019-07-05 DIAGNOSIS — R062 Wheezing: Secondary | ICD-10-CM

## 2019-07-05 DIAGNOSIS — J3489 Other specified disorders of nose and nasal sinuses: Secondary | ICD-10-CM

## 2019-07-05 DIAGNOSIS — R05 Cough: Secondary | ICD-10-CM

## 2019-07-05 DIAGNOSIS — R059 Cough, unspecified: Secondary | ICD-10-CM

## 2019-07-05 DIAGNOSIS — Z20822 Contact with and (suspected) exposure to covid-19: Secondary | ICD-10-CM

## 2019-07-05 MED ORDER — ALBUTEROL SULFATE HFA 108 (90 BASE) MCG/ACT IN AERS: 2.0000 | INHALATION_SPRAY | Freq: Four times a day (QID) | RESPIRATORY_TRACT | 0 refills | Status: AC | PRN

## 2019-07-05 MED ORDER — BENZONATATE 100 MG PO CAPS: 100.0000 mg | ORAL_CAPSULE | Freq: Three times a day (TID) | ORAL | 0 refills | Status: AC | PRN

## 2019-07-05 MED ORDER — DOXYCYCLINE HYCLATE 100 MG PO TABS: 100.0000 mg | ORAL_TABLET | Freq: Two times a day (BID) | ORAL | 0 refills | Status: AC

## 2019-07-05 MED ORDER — FLUTICASONE PROPIONATE 50 MCG/ACT NA SUSP: 2.0000 | Freq: Every day | NASAL | 1 refills | Status: AC

## 2019-07-05 NOTE — Progress Notes (Signed)
Subjective:    Patient ID: Maurice Allen, male    DOB: [birth year], 38 y.o.   MRN: [id number]  HPI  Virtual Visit via Video Note  I connected with [name] on 07/05/19 at 10:00 AM EST by a video enabled telemedicine application and verified that I am speaking with the correct person using two identifiers.  Location: Patient: home Provider: office   I discussed the limitations of evaluation and management by telemedicine and the availability of in person appointments. The patient expressed understanding and agreed to proceed.  Pt does not have bp cuff at home. No temp check.  History of Present Illness: Pt in with nasal congestion, sinus pressure, pnd, chest congestion and productive cough. He get sinus infection and bronchitis this time of the year.  Feel like can't take full deep breath. Has had some wheezing.  No calf swelling or legs pain.  Pt does smoke. He is smoking 1/2-1 pack. Was smoking 1.5-2 pack a day.   No fever, no chills or sweats. No body aches. Pt has normal smell and taste. No diarrhea.    Observations/Objective:  General-no acute distress, pleasant, oriented. Lungs- on inspection lungs appear unlabored. Neck- no tracheal deviation or jvd on inspection. Neuro- gross motor function appears intact. heent- sinus pressure on self palpation.   Assessment and Plan:  You have signs and symptoms today that probably represent bronchitis and sinusitis.(With associated minimal wheezing).  Will prescribe doxycycline antibiotic, benzonatate cough medication, Flonase and albuterol inhaler.  We will proceed with caution and get you tested for Covid as well.  Placed order and please go to to the drive-through center today.  Work note sent to your MyChart account and advised quarantine at home pending Covid test results.  If signs/symptoms worsen or change please notify us.  We will follow early next week as test result comes in and we will see how you are doing.   Then make a determination on exact return to work date. Follow Up Instructions:    I discussed the assessment and treatment plan with the patient. The patient was provided an opportunity to ask questions and all were answered. The patient agreed with the plan and demonstrated an understanding of the instructions.   The patient was advised to call back or seek an in-person evaluation if the symptoms worsen or if the condition fails to improve as anticipated.  I provided 25  minutes of non-face-to-face time during this encounter.   [name], PA-C    Review of Systems  Constitutional: Negative for chills, fatigue and fever.  HENT: Positive for congestion, sinus pressure and sinus pain. Negative for sneezing, sore throat and trouble swallowing.   Respiratory: Positive for cough and wheezing. Negative for chest tightness and shortness of breath.   Cardiovascular: Negative for chest pain and palpitations.  Gastrointestinal: Negative for abdominal pain.  Genitourinary: Negative for dysuria.  Musculoskeletal: Negative for back pain and myalgias.  Skin: Negative for rash.  Neurological: Negative for dizziness, light-headedness and numbness.  Hematological: Negative for adenopathy. Does not bruise/bleed easily.  Psychiatric/Behavioral: Negative for behavioral problems and confusion.    No past medical history on file.   Social History   Socioeconomic History  . Marital status: Married    Spouse name: Not on file  . Number of children: Not on file  . Years of education: Not on file  . Highest education level: Not on file  Occupational History  . Not on file  Social Needs  .  Financial resource strain: Not on file  . Food insecurity    Worry: Not on file    Inability: Not on file  . Transportation needs    Medical: Not on file    Non-medical: Not on file  Tobacco Use  . Smoking status: Current Every Day Smoker    Packs/day: 1.00    Types: Cigarettes  . Smokeless tobacco:  Current User  Substance and Sexual Activity  . Alcohol use: Yes    Comment: occasional.  . Drug use: No  . Sexual activity: Yes  Lifestyle  . Physical activity    Days per week: Not on file    Minutes per session: Not on file  . Stress: Not on file  Relationships  . Social Herbalist on phone: Not on file    Gets together: Not on file    Attends religious service: Not on file    Active member of club or organization: Not on file    Attends meetings of clubs or organizations: Not on file    Relationship status: Not on file  . Intimate partner violence    Fear of current or ex partner: Not on file    Emotionally abused: Not on file    Physically abused: Not on file    Forced sexual activity: Not on file  Other Topics Concern  . Not on file  Social History Narrative  . Not on file    Past Surgical History:  Procedure Laterality Date  . HERNIA REPAIR     4th grade.    Family History  Problem Relation Age of Onset  . Asthma Mother   . Hyperlipidemia Father   . Heart attack Father     No Known Allergies  No current outpatient medications on file prior to visit.   No current facility-administered medications on file prior to visit.     There were no vitals taken for this visit.      Objective:   Physical Exam        Assessment & Plan:

## 2019-07-05 NOTE — Patient Instructions (Signed)
You have signs and symptoms today that probably represent bronchitis and sinusitis.(With associated minimal wheezing).  Will prescribe doxycycline antibiotic, benzonatate cough medication, Flonase and albuterol inhaler.  We will proceed with caution and get you tested for Covid as well.  Placed order and please go to to the [place] center today.  Work note sent to your MyChart account and advised quarantine at home pending Covid test results.  If signs/symptoms worsen or change please notify us.  We will follow early next week as test result comes in and we will see how you are doing.  Then make a determination on exact return to work date.

## 2019-07-08 ENCOUNTER — Encounter: Payer: Self-pay | Admitting: Medical

## 2019-07-08 LAB — NOVEL CORONAVIRUS, NAA: SARS-CoV-2, NAA: NOT DETECTED

## 2019-07-09 ENCOUNTER — Encounter: Payer: Self-pay | Admitting: Medical

## 2020-06-19 IMAGING — DX DG CHEST 2V
2 series · 2 of 2 positions shown · non-contrast
Comparison: None.

CLINICAL DATA: Cough and shortness of breath for 2 weeks.

EXAM:
CHEST - 2 VIEW

[chest pa]
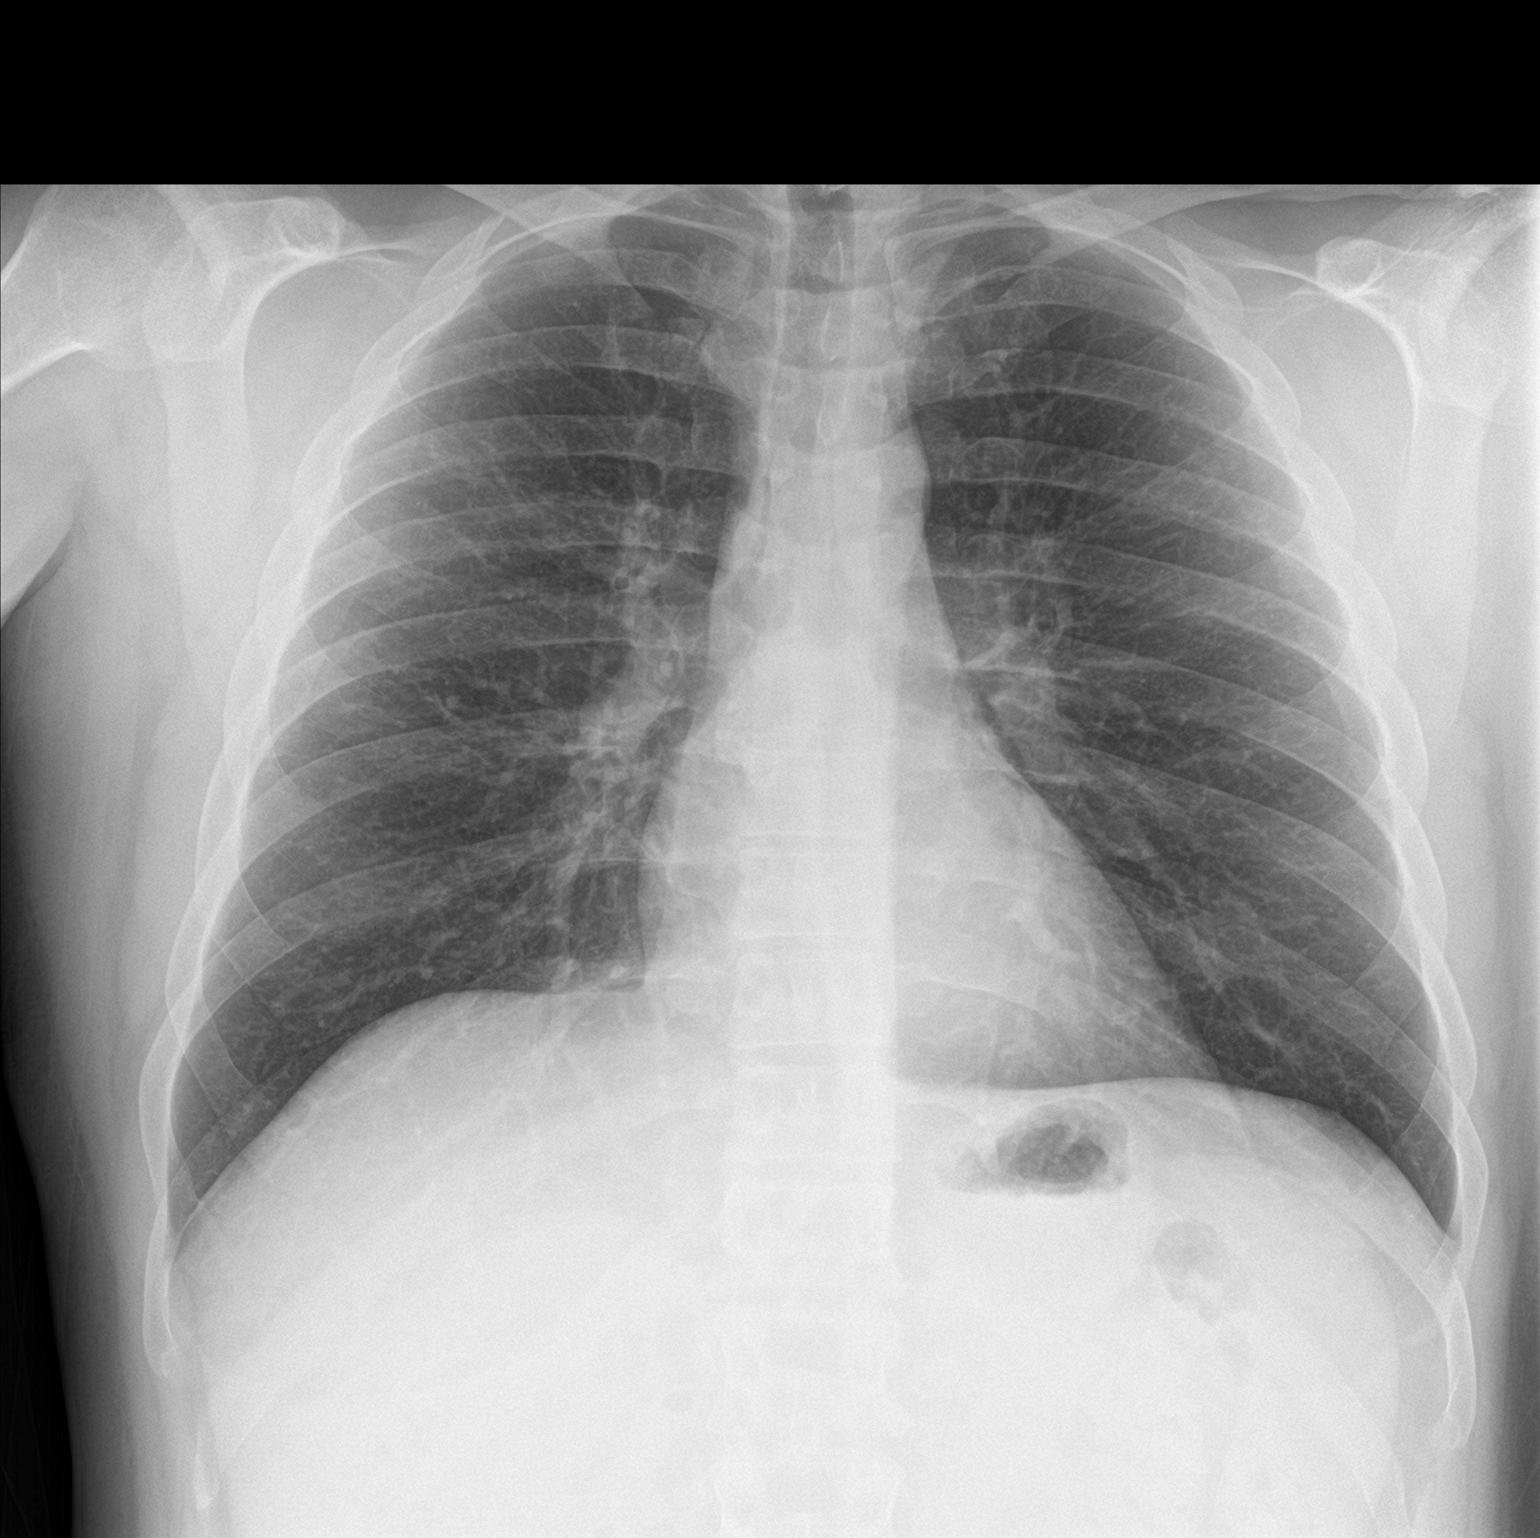

[chest lat]
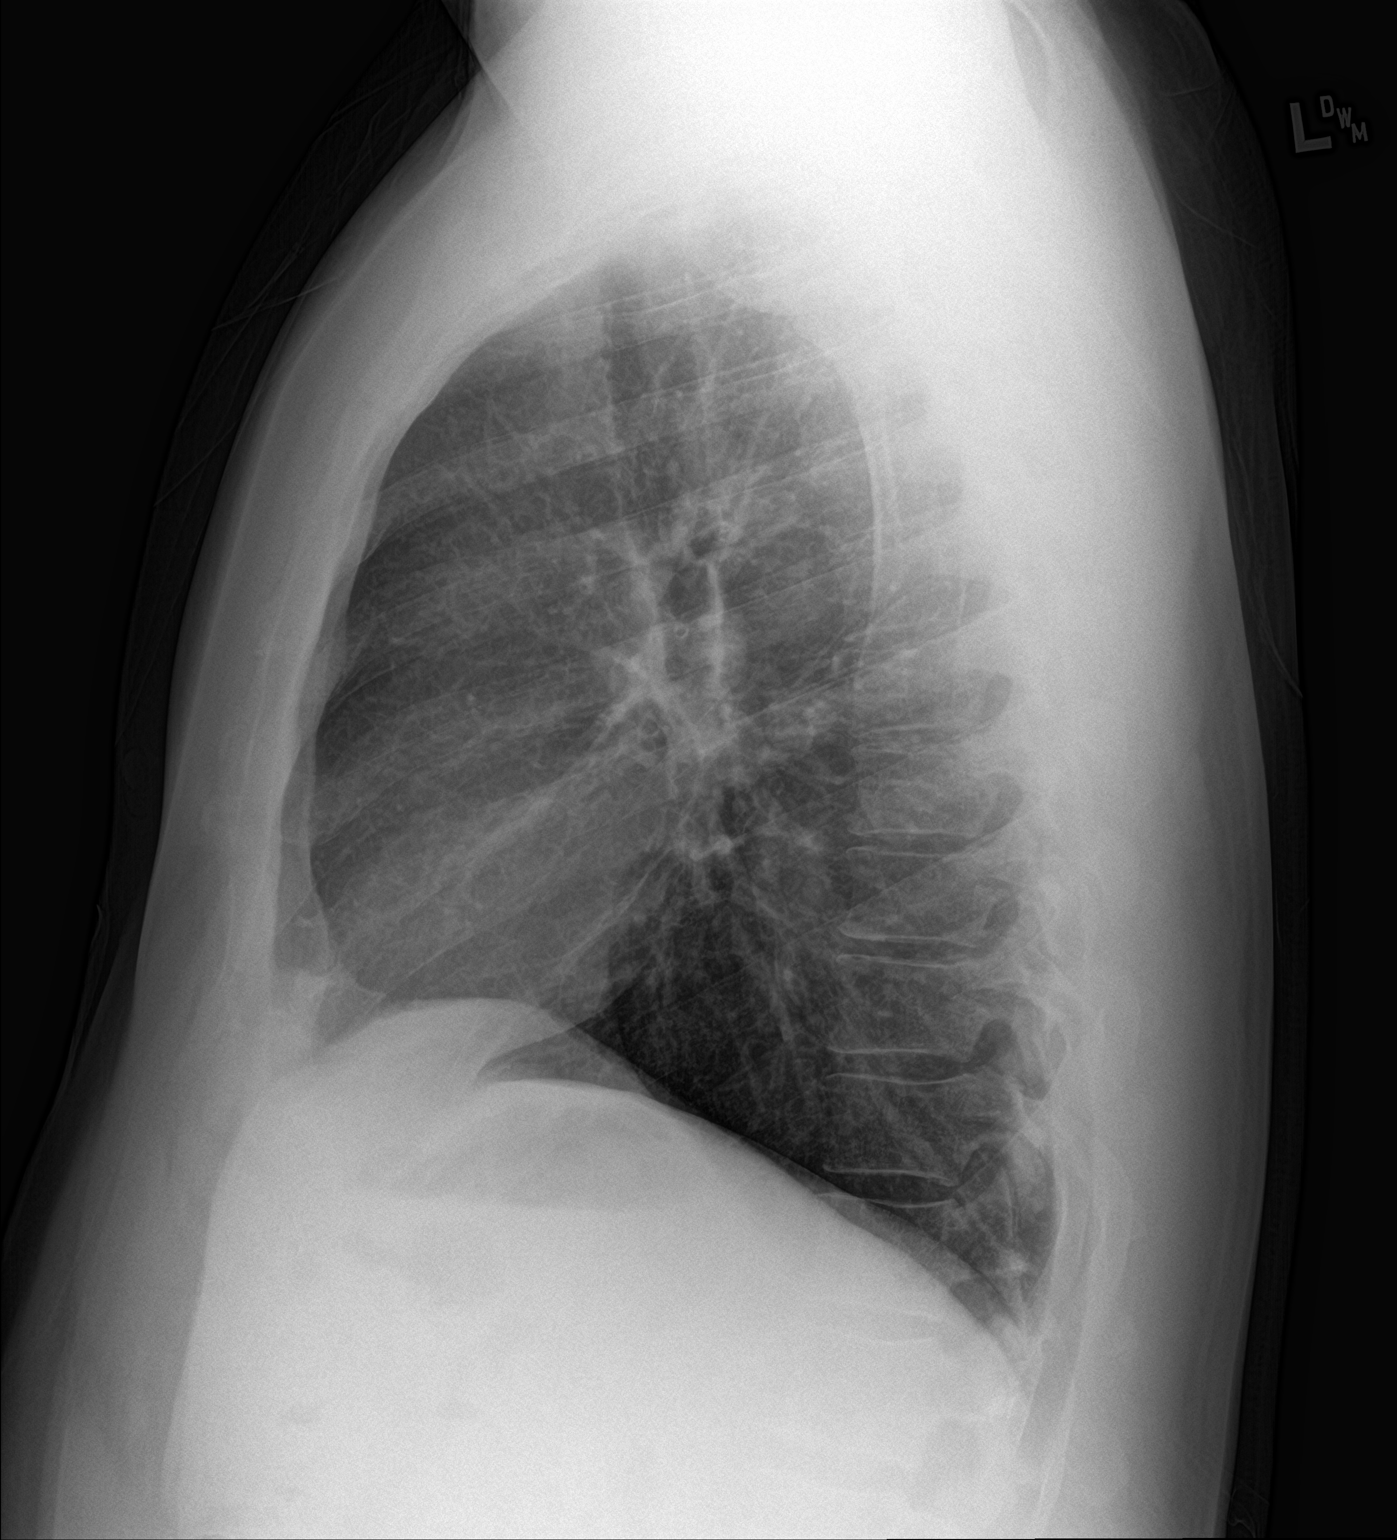

[2 of 2 positions shown; findings below may reference images not displayed]

FINDINGS: The cardiomediastinal silhouette is unremarkable.

There is no evidence of focal airspace disease, pulmonary edema,
suspicious pulmonary nodule/mass, pleural effusion, or pneumothorax.

No acute bony abnormalities are identified.
IMPRESSION: No active cardiopulmonary disease.

## 2022-01-11 ENCOUNTER — Ambulatory Visit: Payer: Self-pay | Admitting: Podiatry

## 2022-01-13 ENCOUNTER — Ambulatory Visit (INDEPENDENT_AMBULATORY_CARE_PROVIDER_SITE_OTHER): Payer: 59 | Admitting: Podiatry

## 2022-01-13 DIAGNOSIS — M722 Plantar fascial fibromatosis: Secondary | ICD-10-CM | POA: Diagnosis not present

## 2022-01-18 ENCOUNTER — Encounter: Payer: Self-pay | Admitting: Podiatry

## 2022-01-18 NOTE — Progress Notes (Signed)
  Subjective:  Patient ID: Maurice Allen, male    DOB: 06/26/1981,  MRN: [id number]  Chief Complaint  Patient presents with   Callouses    41 y.o. male presents with the above complaint.  Patient presents with complaint bilateral heel pain as well for 6 months has progressive gotten worse.  Hurts with ambulation.  He would like to discuss treatment options also taking the first up in the morning.  Pain scale 7 out of 10.  He has not seen anyone as prior to seeing me.  He denies any other acute complaints.   Review of Systems: Negative except as noted in the HPI. Denies N/V/F/Ch.  No past medical history on file.  Current Outpatient Medications:    albuterol (VENTOLIN HFA) 108 (90 Base) MCG/ACT inhaler, Inhale 2 puffs into the lungs every 6 (six) hours as needed for wheezing or shortness of breath., Disp: 18 g, Rfl: 0   benzonatate (TESSALON) 100 MG capsule, Take 1 capsule (100 mg total) by mouth 3 (three) times daily as needed for cough., Disp: 30 capsule, Rfl: 0   doxycycline (VIBRA-TABS) 100 MG tablet, Take 1 tablet (100 mg total) by mouth 2 (two) times daily. Can gives caps or generic., Disp: 20 tablet, Rfl: 0   fluticasone (FLONASE) 50 MCG/ACT nasal spray, Place 2 sprays into both nostrils daily., Disp: 16 g, Rfl: 1  Social History   Tobacco Use  Smoking Status Every Day   Packs/day: 1.00   Types: Cigarettes  Smokeless Tobacco Current    No Known Allergies Objective:  There were no vitals filed for this visit. There is no height or weight on file to calculate BMI. Constitutional Well developed. Well nourished.  Vascular Dorsalis pedis pulses palpable bilaterally. Posterior tibial pulses palpable bilaterally. Capillary refill normal to all digits.  No cyanosis or clubbing noted. Pedal hair growth normal.  Neurologic Normal speech. Oriented to person, place, and time. Epicritic sensation to light touch grossly present bilaterally.  Dermatologic Nails well groomed and  normal in appearance. No open wounds. No skin lesions.  Orthopedic: Normal joint ROM without pain or crepitus bilaterally. No visible deformities. Tender to palpation at the calcaneal tuber bilaterally. No pain with calcaneal squeeze bilaterally. Ankle ROM diminished range of motion bilaterally. Silfverskiold Test: positive bilaterally.   Radiographs: None  Assessment:   1. Plantar fasciitis of right foot   2. Plantar fasciitis of left foot    Plan:  Patient was evaluated and treated and all questions answered.  Plantar Fasciitis, bilaterally - XR reviewed as above.  - Educated on icing and stretching. Instructions given.  - Injection delivered to the plantar fascia as below. - DME: Plantar fascial brace dispensed to support the medial longitudinal arch of the foot and offload pressure from the heel and prevent arch collapse during weightbearing - Pharmacologic management: None  Procedure: Injection Tendon/Ligament Location: Bilateral plantar fascia at the glabrous junction; medial approach. Skin Prep: alcohol Injectate: 0.5 cc 0.5% marcaine plain, 0.5 cc of 1% Lidocaine, 0.5 cc kenalog 10. Disposition: Patient tolerated procedure well. Injection site dressed with a band-aid.  No follow-ups on file.

## 2022-02-24 ENCOUNTER — Ambulatory Visit: Payer: 59 | Admitting: Podiatry

## 2022-03-01 ENCOUNTER — Ambulatory Visit: Payer: 59 | Admitting: Podiatry

## 2024-03-28 ENCOUNTER — Other Ambulatory Visit: Payer: Self-pay

## 2024-03-28 ENCOUNTER — Other Ambulatory Visit (HOSPITAL_BASED_OUTPATIENT_CLINIC_OR_DEPARTMENT_OTHER): Payer: Self-pay

## 2024-03-28 ENCOUNTER — Emergency Department (HOSPITAL_BASED_OUTPATIENT_CLINIC_OR_DEPARTMENT_OTHER): Admission: EM | Admit: 2024-03-28 | Discharge: 2024-03-28 | Disposition: A | Discharge status: Home / Self Care

## 2024-03-28 ENCOUNTER — Encounter (HOSPITAL_BASED_OUTPATIENT_CLINIC_OR_DEPARTMENT_OTHER): Payer: Self-pay | Admitting: Emergency Medicine

## 2024-03-28 DIAGNOSIS — M545 Low back pain, unspecified: Secondary | ICD-10-CM | POA: Diagnosis present

## 2024-03-28 DIAGNOSIS — F17200 Nicotine dependence, unspecified, uncomplicated: Secondary | ICD-10-CM | POA: Diagnosis not present

## 2024-03-28 DIAGNOSIS — M5442 Lumbago with sciatica, left side: Secondary | ICD-10-CM | POA: Diagnosis not present

## 2024-03-28 MED ORDER — LIDOCAINE 5 % EX PTCH
1.0000 | MEDICATED_PATCH | CUTANEOUS | 0 refills | Status: AC
  Filled 2024-03-28: qty 30, 30d supply, fill #0

## 2024-03-28 MED ORDER — OXYCODONE-ACETAMINOPHEN 5-325 MG PO TABS
2.0000 | ORAL_TABLET | Freq: Once | ORAL | Status: AC
  Administered 2024-03-28: 2 via ORAL
  Filled 2024-03-28: qty 2

## 2024-03-28 MED ORDER — LIDOCAINE 5 % EX PTCH
1.0000 | MEDICATED_PATCH | CUTANEOUS | Status: DC
  Administered 2024-03-28: 1 via TRANSDERMAL
  Filled 2024-03-28: qty 1

## 2024-03-28 MED ORDER — PREDNISONE 20 MG PO TABS
40.0000 mg | ORAL_TABLET | Freq: Every day | ORAL | 0 refills | Status: AC
  Filled 2024-03-28: qty 6, 3d supply, fill #0

## 2024-03-28 MED ORDER — OXYCODONE-ACETAMINOPHEN 5-325 MG PO TABS
1.0000 | ORAL_TABLET | Freq: Four times a day (QID) | ORAL | 0 refills | Status: AC | PRN
  Filled 2024-03-28: qty 12, 3d supply, fill #0

## 2024-03-28 MED ORDER — KETOROLAC TROMETHAMINE 15 MG/ML IJ SOLN
15.0000 mg | Freq: Once | INTRAMUSCULAR | Status: AC
Start: 2024-03-28 — End: 2024-03-28
  Administered 2024-03-28: 15 mg via INTRAMUSCULAR
  Filled 2024-03-28: qty 1

## 2024-03-28 NOTE — ED Provider Notes (Signed)
 [place] [facility] Provider Note   CSN: [id number] Arrival date & time: 03/28/24  1004     Patient presents with: Back Pain   Maurice Allen is a 43 y.o. 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  Patient states that he was trying to break up his dogs as they were fighting with each other and tripped over one of the dogs and caused a twisting type injury after trying to fall backwards.  He did not directly land on the back but since then he has been having worsening pain.  Pain is localized to the left lower back and radiates down the front aspect of the left leg.  Pain is worse with any movement.  Patient unable to straighten the leg secondary to pain he denies any bowel or bladder incontinence.  Patient currently smokes about half pack per day.  Denies any IV drug use.  No fever or chills.    Back Pain      Prior to Admission medications   Medication Sig Start Date End Date Taking? Authorizing Provider  lidocaine (LIDODERM) 5 % Place 1 patch onto the skin daily. Remove and discard patch within 12 hours or as directed by MD. 03/28/24  Yes Maurice, Minh Roanhorse M, PA-C  oxyCODONE-acetaminophen (PERCOCET/ROXICET) 5-325 MG tablet Take 1 tablet by mouth every 6 (six) hours as needed for up to 3 days for severe pain (pain score 7-10). 03/28/24 03/31/24 Yes Maurice Kupper M, PA-C  predniSONE (DELTASONE) 20 MG tablet Take 2 tablets (40 mg total) by mouth daily. 03/28/24  Yes Maurice, Auden Wettstein M, PA-C  albuterol (VENTOLIN HFA) 108 (90 Base) MCG/ACT inhaler Inhale 2 puffs into the lungs every 6 (six) hours as needed for wheezing or shortness of breath. 07/05/19   Saguier, [place], PA-C  benzonatate (TESSALON) 100 MG capsule Take 1 capsule (100 mg total) by mouth 3 (three) times daily as needed for cough. 07/05/19   Saguier, [place], PA-C   doxycycline (VIBRA-TABS) 100 MG tablet Take 1 tablet (100 mg total) by mouth 2 (two) times daily. Can gives caps or generic. 07/05/19   Saguier, [place], PA-C  fluticasone [facility]) 50 MCG/ACT nasal spray Place 2 sprays into both nostrils daily. 07/05/19   Saguier, [place], PA-C    Allergies: Patient has no known allergies.    Review of Systems  Musculoskeletal:  Positive for back pain.  All other systems reviewed and are negative.   Updated Vital Signs BP (!) 138/97   Pulse 86   Temp 97.9 F (36.6 C) (Oral)   Resp 16   Ht 5' 9 (1.753 Allen)   Wt 97.5 kg   SpO2 99%   BMI 31.75 kg/Allen   Physical Exam Vitals and nursing note reviewed.  Constitutional:      Appearance: Normal appearance.  HENT:     Head: Normocephalic and atraumatic.  Eyes:     General:        Right eye: No discharge.        Left eye: No discharge.     Conjunctiva/sclera: Conjunctivae normal.  Pulmonary:     Effort: Pulmonary effort is normal.  Musculoskeletal:       Arms:     Comments: Equal strength to the lower extremities.  Normal sensation to the lower extremities.  Patient does have some tenderness to the upper left thigh with  minimal palpation.  Skin:    General: Skin is warm and dry.     Findings: No rash.  Neurological:     General: No focal deficit present.     Mental Status: He is alert.  Psychiatric:        Mood and Affect: Mood normal.        Behavior: Behavior normal.     (all labs ordered are listed, but only abnormal results are displayed) Labs Reviewed - No data to display  EKG: None  Radiology: No results found.   Procedures   Medications Ordered in the ED  lidocaine  (LIDODERM ) 5 % 1 patch (1 patch Transdermal Patch Applied 03/28/24 1105)  oxyCODONE -acetaminophen  (PERCOCET/ROXICET) 5-325 MG per tablet 2 tablet (2 tablets Oral Given 03/28/24 1101)  ketorolac  (TORADOL ) 15 MG/ML injection 15 mg (15 mg Intramuscular Given 03/28/24 1102)    Clinical Course as of 03/28/24 1158   Thu Mar 28, 2024  1152 Patient is currently a 4/10 in severity.  He was at a 10/10 in severity before pain medications.  Patient able to stand up now and ambulate.  We went over etiologies of his back pain, likely causes, and overall treatment plan.  Patient agreeable.  He will contact his orthopedist for further evaluation.  Strict return precautions were discussed. [CF]    Clinical Course User Index [CF] Maurice Cameron HERO, PA-C    Medical Decision Making Maurice Allen is a 43 y.o. male patient who presents to the emergency department today for further evaluation of left lower back pain with radiation down the left lower extremity.  Low suspicion for cauda equina syndrome and epidural abscess at this time.  This could just be an acute exacerbation of chronic pain given that twisting type injury.  Will start with Percocet, Toradol , and lidocaine  patches and plan to reassess.  Do not feel that imaging is warranted at this time.  Strength is intact and sensation is intact and equal.  Patient endorses significant improvement after pain medications.  Patient is still slightly hypertensive but I attribute this to pain.  Does not take any antihypertensives.  Will treat with short course of narcotic pain medication, steroids, lidocaine  patches, and NSAIDs at home.  He will call his orthopedist for further evaluation.  Strict turn precautions were discussed.  He is safe for discharge.   Risk Prescription drug management.    Final diagnoses:  Acute left-sided low back pain with left-sided sciatica    ED Discharge Orders          Ordered    lidocaine  (LIDODERM ) 5 %  Every 24 hours        03/28/24 1155    predniSONE  (DELTASONE ) 20 MG tablet  Daily        03/28/24 1155    oxyCODONE -acetaminophen  (PERCOCET/ROXICET) 5-325 MG tablet  Every 6 hours PRN        03/28/24 1155               Maurice Cameron [place], PA-C 03/28/24 1158    Maurice Caron PARAS, DO 03/28/24 1433

## 2024-03-28 NOTE — Discharge Instructions (Addendum)
 As we discussed, you can take 600 mg of ibuprofen every 6 hours as needed for pain.  You can use narcotic pain medication for breakthrough pain primarily at night.  You can place the lidocaine  patches over the area as well.  Please take prednisone  as prescribed.  I would like for you to call your orthopedist to schedule a follow-up appointment.  You may return to the emergency department for any worsening symptoms.

## 2024-03-28 NOTE — ED Triage Notes (Signed)
 Tripped over dog on Tuesday and now feel like he has knot  in back , pain shoots down leg, he cannot bear wt and hurts to move states has 2  herniated disc and a bone spur in back
